# Patient Record
Sex: Male | Born: 2005 | Hispanic: Yes | Marital: Single | Smoking: Never smoker
Health system: Southern US, Community
[De-identification: ages and names within clinical notes are randomized; demographics above are authoritative.]

## PROBLEM LIST (undated history)

## (undated) DIAGNOSIS — F919 Conduct disorder, unspecified: Secondary | ICD-10-CM

## (undated) DIAGNOSIS — F1220 Cannabis dependence, uncomplicated: Secondary | ICD-10-CM

## (undated) DIAGNOSIS — F319 Bipolar disorder, unspecified: Secondary | ICD-10-CM

## (undated) DIAGNOSIS — R454 Irritability and anger: Secondary | ICD-10-CM

## (undated) DIAGNOSIS — F913 Oppositional defiant disorder: Secondary | ICD-10-CM

## (undated) DIAGNOSIS — F419 Anxiety disorder, unspecified: Secondary | ICD-10-CM

## (undated) DIAGNOSIS — F6381 Intermittent explosive disorder: Secondary | ICD-10-CM

## (undated) DIAGNOSIS — F909 Attention-deficit hyperactivity disorder, unspecified type: Secondary | ICD-10-CM

## (undated) DIAGNOSIS — R4587 Impulsiveness: Secondary | ICD-10-CM

---
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
## 2024-02-09 ENCOUNTER — Ambulatory Visit (HOSPITAL_COMMUNITY): Payer: MEDICAID | Admitting: Psychiatry
